# Patient Record
Sex: Male | Born: 1975 | Race: White | Hispanic: No | Marital: Married | Smoking: Never smoker
Health system: Southern US, Community
[De-identification: ages and names within clinical notes are randomized; demographics above are authoritative.]

## PROBLEM LIST (undated history)

## (undated) DIAGNOSIS — G4730 Sleep apnea, unspecified: Secondary | ICD-10-CM

## (undated) DIAGNOSIS — I10 Essential (primary) hypertension: Secondary | ICD-10-CM

## (undated) DIAGNOSIS — R011 Cardiac murmur, unspecified: Secondary | ICD-10-CM

## (undated) DIAGNOSIS — E7800 Pure hypercholesterolemia, unspecified: Secondary | ICD-10-CM

## (undated) DIAGNOSIS — R519 Headache, unspecified: Secondary | ICD-10-CM

## (undated) DIAGNOSIS — J302 Other seasonal allergic rhinitis: Secondary | ICD-10-CM

## (undated) DIAGNOSIS — R51 Headache: Secondary | ICD-10-CM

## (undated) HISTORY — DX: Pure hypercholesterolemia, unspecified: E78.00

## (undated) HISTORY — DX: Headache, unspecified: R51.9

## (undated) HISTORY — PX: VASECTOMY: SHX75

## (undated) HISTORY — DX: Headache: R51

## (undated) HISTORY — PX: OTHER SURGICAL HISTORY: SHX169

## (undated) HISTORY — DX: Essential (primary) hypertension: I10

## (undated) HISTORY — DX: Cardiac murmur, unspecified: R01.1

## (undated) HISTORY — DX: Sleep apnea, unspecified: G47.30

## (undated) HISTORY — DX: Other seasonal allergic rhinitis: J30.2

---

## 2015-11-18 ENCOUNTER — Encounter: Payer: Self-pay | Admitting: Neurology

## 2015-11-18 ENCOUNTER — Ambulatory Visit (INDEPENDENT_AMBULATORY_CARE_PROVIDER_SITE_OTHER): Payer: BLUE CROSS/BLUE SHIELD | Admitting: Neurology

## 2015-11-18 ENCOUNTER — Ambulatory Visit: Payer: Self-pay | Admitting: Neurology

## 2015-11-18 VITALS — BP 158/88 | HR 88 | Resp 20 | Ht 71.0 in | Wt 265.0 lb

## 2015-11-18 DIAGNOSIS — G4733 Obstructive sleep apnea (adult) (pediatric): Secondary | ICD-10-CM

## 2015-11-18 DIAGNOSIS — Z9989 Dependence on other enabling machines and devices: Secondary | ICD-10-CM

## 2015-11-18 DIAGNOSIS — D497 Neoplasm of unspecified behavior of endocrine glands and other parts of nervous system: Secondary | ICD-10-CM | POA: Diagnosis not present

## 2015-11-18 DIAGNOSIS — G43101 Migraine with aura, not intractable, with status migrainosus: Secondary | ICD-10-CM | POA: Diagnosis not present

## 2015-11-18 DIAGNOSIS — M2619 Other specified anomalies of jaw-cranial base relationship: Secondary | ICD-10-CM | POA: Diagnosis not present

## 2015-11-18 MED ORDER — TOPIRAMATE 25 MG PO TABS: ORAL_TABLET | ORAL | Status: AC

## 2015-11-18 NOTE — Progress Notes (Signed)
S[facility]EP MED[facility]INE [facility]IC   Provider:  Larey Seat, M D  Referring Provider: Pomposini, Cherly Anderson, MD Primary Care Physician:  No primary care provider on file.  Chief Complaint  Patient presents with  . New Patient (Initial Visit)    6 headaches in the last 11 days, pt has been tracking headaches, rm 11  . headaches    HPI:  Joe Thompson is a 40 y.o. male, seen here as a referral from Dr. Harmon Thompson of [place], [place]. , for a headache evaluation,  And the origin of his headache seems to be in the temple area bilaterally. He describes by a hand motion that the headaches originated at these trigger zones then radiates over the temporal parietal area towards the back of the head. The headaches develop during the course of the day. He has not been woken out of sleep by these headaches and only rare times woken with a headache. There is some photophobia described sensitivity to light. And just yesterday his headaches were accompanied by nausea. By the headaches can arise after lunch or during the afternoon they may last as short as 30 minutes given that he has Access Excedrin Migraine medication or they may last several hours if remaining untreated. He would estimate to have had 20 headaches in summer/ Fall. Began tracking them over the last 3 month increasing in frequency. He has always had headaches with similar quality and intensity , but less frequently. Yesterday , he felt he needed to hide from light and sound, and bacame sick. These intense headaches occur, about 3-4 times a month, the less intense 3-4 times a week.    He has been gainfully employed in the same position but over the last year he has been exposed to more stress, as his company went to acquire a new [employer].  Joe Thompson has not noted any associated sinus congestion, bronchitis, febrile illness, diarrhea only nausea.  He has never been treated with preventive medications for migraines.  We discussed propranolol, depakote  and topiramate and settled on topamax. topiramate has the added benefit of assisting and weight loss. Joe Thompson also is a CPAP user and was diagnosed with obstructive sleep apnea about 9 months ago. The implementation of CPAP therapy did not affect headache frequency or character of the headaches. He had a diagnostic AHi of 100/hr. And has full facial hair. Now 2 per hour. Using a nasal pillow. Sleep habits are as follows: most nights sleeps 6-7 hours on CPAP.    Medical history and family history: Brother has migraines, sister used to have them. Son has headaches , coing home from school with vision and nausea. 38 years old.   Social history: married, full time employed, one son. Non smoker, caffeine user, 5-7  drinks a day, coffee, iced tea, soda.   Review of Systems: Out of a complete 14 system review, the patient complains of only the following symptoms, and all other reviewed systems are negative.  HA   Social History   Social History  . Marital Status: Married    Spouse Name: N/A  . Number of Children: N/A  . Years of Education: N/A   Occupational History  .      works from [employer]   Social History Main Topics  . Smoking status: Never Smoker   . Smokeless tobacco: Not on file  . Alcohol Use: No  . Drug Use: No  . Sexual Activity: Not on file   Other Topics Concern  .  Not on file   Social History Narrative   Drinks 6-8 cups of caffeine daily.    Family History  Problem Relation Age of Onset  . Sleep apnea Father   . Hypertension Father     Past Medical History  Diagnosis Date  . Hypertension   . High cholesterol   . Seasonal allergies     allergy shots  . Headache   . Sleep apnea   . Heart murmur     Past Surgical History  Procedure Laterality Date  . Hernia repair    . Vasectomy      Current Outpatient Prescriptions  Medication Sig Dispense Refill  . atorvastatin (LIPITOR) 10 MG tablet Take 10 mg by mouth daily.    . ergocalciferol (VITAMIN D2) 50000  units capsule Take 50,000 Units by mouth once a week.    . telmisartan-hydrochlorothiazide (MICARDIS HCT) 40-12.5 MG tablet Take 1 tablet by mouth daily.    . vitamin A 8000 UNIT capsule Take 8,000 Units by mouth daily.    . vitamin C (ASCORBIC ACID) 500 MG tablet Take 500 mg by mouth daily.     No current facility-administered medications for this visit.    Allergies as of 11/18/2015  . (Not on File)    Vitals: BP 158/88 mmHg  Pulse 88  Resp 20  Ht 5\' 11"  (1.803 m)  Wt 265 lb (120.203 kg)  BMI 36.98 kg/m2 Last Weight:  Wt Readings from Last 1 Encounters:  11/18/15 265 lb (120.203 kg)   [id number] mass index is 36.98 kg/(m^2).     Last Height:   Ht Readings from Last 1 Encounters:  11/18/15 5\' 11"  (1.803 m)    Physical exam:  General: The patient is awake, alert and appears not in acute distress. The patient is well groomed. Head: Normocephalic, atraumatic. Neck is supple. Mallampati 4,  neck circumference:19.  Retrognathia is seen.  Cardiovascular:  Regular rate and rhythm , without  murmurs or carotid bruit, and without distended neck veins. Respiratory: Lungs are clear to auscultation. Skin:  Without evidence of edema, or rash Trunk: BMI is 37. The patient's posture is erect    Neurologic exam : The patient is awake and alert, oriented to place and time.   Memory subjective  described as intact.  Attention span & concentration ability appears normal.  Speech is fluent,  without dysarthria, dysphonia or aphasia.  Mood and affect are appropriate.  Cranial nerves: Pupils are equal and briskly reactive to light. Funduscopic exam without evidence of pallor or edema.  Extraocular movements  in vertical and horizontal planes intact and without nystagmus. Visual fields by finger perimetry are intact. Hearing to finger rub intact.   Facial sensation intact to fine touch.  Facial motor strength is symmetric and tongue and uvula move midline. Shoulder shrug was symmetrical.    Motor exam:  Normal tone, muscle bulk and symmetric strength in all extremities.  Sensory:  Fine touch, pinprick and vibration were tested in all extremities. Proprioception tested in the upper extremities was normal.  Coordination: Rapid alternating movements in the fingers/hands was normal. Finger-to-nose maneuver  normal without evidence of ataxia, dysmetria or tremor.  Gait and station: Patient walks without assistive device and is able unassisted to climb up to the exam table. Strength within normal limits.  Stance is stable and normal .Tandem gait is unfragmented. Turns with 3 Steps. Romberg testing is negative.  Deep tendon reflexes: in the  upper and lower extremities are attenuated , but symmetric .  Babinski maneuver response is  downgoing.  The patient was advised of the nature of the diagnosed sleep disorder , the treatment options and risks for general a health and wellness arising from not treating the condition.  I spent more than 40 minutes of face to face time with the patient. Greater than 50% of time was spent in counseling and coordination of care. We have discussed the diagnosis and differential and I answered the patient's questions.     Assessment:  After physical and neurologic examination, review of laboratory studies,  Personal review of imaging studies, reports of other /same  Imaging studies,  Results of polysomnography/ neurophysiology testing and pre-existing records as far as provided in visit., my assessment is   1) Migraine headaches, increasing with stress. Not improved by sleep.  2) The patient has migraines- needs to reduce caffeine and start topiramate 25 mg at night. After 8 days start to take either 25 bid, or 50 at night.  Keep headache diary.   3) OSA treated by Dr. Felton Clinton, [place].  4) obesity- higher risk factor for vascular headaches, he has HTN, too. On HCTZ.  Plan:  Treatment plan and additional workup :  Prescribed topiramate, if not  tolerated will use Propranolol.   Asencion Partridge Lue Sykora MD  11/18/2015   CC: Cherly Thompson Pomposini, Md No address on file

## 2015-11-18 NOTE — Patient Instructions (Signed)

## 2015-11-24 ENCOUNTER — Telehealth: Payer: Self-pay | Admitting: Neurology

## 2015-11-24 NOTE — Telephone Encounter (Signed)
Spoke with pt. He is concerned about the side effects of topamax and therefore has not started taking the topamax. He wanted to inform Dr. Brett Fairy that he is going to try and reduce his caffeine intake to hopefully reduce his headaches before starting the topamax. He just wanted Dr. Brett Fairy to be aware of this and if she has any contraindications to him doing this, to let him know.  I advised pt that I would inform Dr. Brett Fairy and if she has any further feedback for the pt, that I would call him back. Pt verbalized understanding.

## 2015-11-24 NOTE — Telephone Encounter (Signed)
Pt called said he concerned about side effects from topiramate (TOPAMAX) 25 MG tablet . He said Dr Brett Fairy had mentioned some life style changes he could make that would help to decrease HA's. He is inquiring if he could try life style changes (dietary) and wait on trying the medication.

## 2015-11-24 NOTE — Telephone Encounter (Signed)
Called to discuss. Line was busy.

## 2015-12-09 ENCOUNTER — Ambulatory Visit
Admission: RE | Admit: 2015-12-09 | Discharge: 2015-12-09 | Disposition: A | Discharge status: Home / Self Care | Payer: BLUE CROSS/BLUE SHIELD | Source: Ambulatory Visit | Attending: Neurology | Admitting: Neurology

## 2015-12-09 ENCOUNTER — Telehealth: Payer: Self-pay | Admitting: Neurology

## 2015-12-09 ENCOUNTER — Ambulatory Visit (INDEPENDENT_AMBULATORY_CARE_PROVIDER_SITE_OTHER): Payer: BLUE CROSS/BLUE SHIELD

## 2015-12-09 DIAGNOSIS — G43101 Migraine with aura, not intractable, with status migrainosus: Secondary | ICD-10-CM | POA: Diagnosis not present

## 2015-12-09 DIAGNOSIS — M2619 Other specified anomalies of jaw-cranial base relationship: Secondary | ICD-10-CM

## 2015-12-09 DIAGNOSIS — G4733 Obstructive sleep apnea (adult) (pediatric): Secondary | ICD-10-CM

## 2015-12-09 DIAGNOSIS — D497 Neoplasm of unspecified behavior of endocrine glands and other parts of nervous system: Secondary | ICD-10-CM

## 2015-12-09 DIAGNOSIS — Z9989 Dependence on other enabling machines and devices: Secondary | ICD-10-CM

## 2015-12-09 NOTE — Telephone Encounter (Signed)
Spoke to Dr. Brett Fairy regarding this order. She asked me to place an order for XR orbits for pt. Order placed.

## 2015-12-09 NOTE — Telephone Encounter (Signed)
Patient needs to have a xray of his orbits due to him possibly having metal in his eyes. Patient is scheduled for today @ 1:15PM for his MRI. Patient will need to go to [facility] to have his xray of orbits today before his MRI appointment. Patient is aware of this. Please place in the xray order. Thanks!

## 2015-12-10 MED ORDER — GADOPENTETATE DIMEGLUMINE 469.01 MG/ML IV SOLN: 20.0000 mL | Freq: Once | INTRAVENOUS | Status: AC | PRN

## 2015-12-14 ENCOUNTER — Ambulatory Visit: Payer: Self-pay | Admitting: Neurology

## 2015-12-16 ENCOUNTER — Telehealth: Payer: Self-pay | Admitting: Neurology

## 2015-12-16 NOTE — Telephone Encounter (Signed)
Patient called to request MRI results, please call [phone].

## 2015-12-16 NOTE — Telephone Encounter (Signed)
Spoke to patient and he is aware of results.

## 2015-12-16 NOTE — Telephone Encounter (Signed)
-----   Message from Larey Seat, MD sent at 12/16/2015  4:59 PM EST ----- Normal MRI

## 2016-01-18 ENCOUNTER — Ambulatory Visit: Payer: BLUE CROSS/BLUE SHIELD | Admitting: Neurology

## 2016-02-04 ENCOUNTER — Ambulatory Visit (INDEPENDENT_AMBULATORY_CARE_PROVIDER_SITE_OTHER): Payer: BLUE CROSS/BLUE SHIELD | Admitting: Neurology

## 2016-02-04 ENCOUNTER — Encounter: Payer: Self-pay | Admitting: Neurology

## 2016-02-04 VITALS — BP 120/88 | HR 78 | Resp 20 | Ht 70.0 in | Wt 261.0 lb

## 2016-02-04 DIAGNOSIS — J322 Chronic ethmoidal sinusitis: Secondary | ICD-10-CM | POA: Insufficient documentation

## 2016-02-04 DIAGNOSIS — J0121 Acute recurrent ethmoidal sinusitis: Secondary | ICD-10-CM | POA: Diagnosis not present

## 2016-02-04 DIAGNOSIS — B028 Zoster with other complications: Secondary | ICD-10-CM

## 2016-02-04 DIAGNOSIS — G44039 Episodic paroxysmal hemicrania, not intractable: Secondary | ICD-10-CM

## 2016-02-04 DIAGNOSIS — B029 Zoster without complications: Secondary | ICD-10-CM | POA: Insufficient documentation

## 2016-02-04 NOTE — Progress Notes (Signed)
[facility]EP [facility]   Provider:  Larey Seat, M D  Referring Provider: Clinton Quant, MD Primary Care Physician:  Clinton Quant, MD  Chief Complaint  Patient presents with  . Follow-up    headaches are better, still very tired, has cut caffeine out of his diet, stopped taking topamax, rm 11, alone    HPI:  Joe Thompson is a 40 y.o. male,  Was seen here as a referral from Dr. Harmon Pier of [place], [place]. , for a headache evaluation,  And the origin of his headache seems to be in the temple area bilaterally. He describes by a hand motion that the headaches originated at these trigger zones then radiates over the temporal parietal area towards the back of the head. The headaches develop during the course of the day. He has not been woken out of sleep by these headaches and only rare times woken with a headache. There is some photophobia described sensitivity to light. And just yesterday his headaches were accompanied by nausea. By the headaches can arise after lunch or during the afternoon they may last as short as 30 minutes given that he has Access Excedrin Migraine medication or they may last several hours if remaining untreated. He would estimate to have had 20 headaches in summer/ Fall. Began tracking them over the last 3 month increasing in frequency. He has always had headaches with similar quality and intensity , but less frequently. Yesterday , he felt he needed to hide from light and sound, and bacame sick. These intense headaches occur, about 3-4 times a month, the less intense 3-4 times a week.   He has been gainfully employed in the same position but over the last year he has been exposed to more stress, as his company went to acquire a new [place].  Joe Thompson has not noted any associated sinus congestion, bronchitis, febrile illness, diarrhea only nausea.  He has never been treated with preventive medications for migraines.  We discussed propranolol, depakote  and topiramate and settled on topamax. topiramate has the added benefit of assisting and weight loss. Joe Thompson also is a CPAP user and was diagnosed with obstructive sleep apnea about 9 months ago. The implementation of CPAP therapy did not affect headache frequency or character of the headaches. He had a diagnostic AHi of 100/hr. And has full facial hair. Now 2 per hour. Using a nasal pillow. Sleep habits are as follows: most nights sleeps 6-7 hours on CPAP.    Medical history and family history: Brother has migraines, sister used to have them. Son has headaches , coing home from school with vision and nausea. 38 years old. Social history: married, full time employed, one son. Non smoker, caffeine user, 5-7  drinks a day, coffee, iced tea, soda.    Interval history from 02/04/2016. Joe Thompson reports that his headaches have significantly improved since he eliminated or reduced caffeine significantly from his daily intake. He also could discontinue topiramate and still does not have the same headache frequency and intensity as before. An MRI of the brain have been obtained and interpreted by my colleague Dr. Bonnita Levan on 12-09-15 and revealed no abnormal structures. The patient reports that he is sleepier, may be because he is has reduced his caffeine intake but his most significant symptom is fatigue. He endorsed today the Epworth score at 6 and fatigue severity score at 46 points. We also here to review the compliance data from his recent sleep study ( AHI 100!) .  I had mentioned in this last visit that the implementation of CPAP therapy did not affect his headache overall. He has frequent sinus congestion.  He remains a very compliant user of CPAP was 97% compliance for days, 93% compliance for over 4 hours and at a CPAP pressure of 10 cm water pressure. There is a setting for 2 cm water expiratory pressure relief. Average user time is 6 hours and 9 minutes and residual AHI is 1.5- this is an excellent  resolution of apnea. He does have occasional air leaks and they increase at the time his interface needs to be replaced. The patient does have full facial hair and retrognathia  Review of Systems: Out of a complete 14 system review, the patient complains of only the following symptoms, and all other reviewed systems are negative.  HA improved, fatigue worsened, change in caffeine intake. No longer on TPM.   Social History   Social History  . Marital Status: Married    Spouse Name: N/A  . Number of Children: N/A  . Years of Education: N/A   Occupational History  .      works from [employer]   Social History Main Topics  . Smoking status: Never Smoker   . Smokeless tobacco: Not on file  . Alcohol Use: No  . Drug Use: No  . Sexual Activity: Not on file   Other Topics Concern  . Not on file   Social History Narrative   Drinks 6-8 cups of caffeine daily.    Family History  Problem Relation Age of Onset  . Sleep apnea Father   . Hypertension Father     Past Medical History  Diagnosis Date  . Hypertension   . High cholesterol   . Seasonal allergies     allergy shots  . Headache   . Sleep apnea   . Heart murmur     Past Surgical History  Procedure Laterality Date  . Hernia repair    . Vasectomy      Current Outpatient Prescriptions  Medication Sig Dispense Refill  . atorvastatin (LIPITOR) 10 MG tablet Take 10 mg by mouth daily.    . ergocalciferol (VITAMIN D2) 50000 units capsule Take 50,000 Units by mouth once a week.    . Multiple Vitamins-Minerals (CENTRUM SILVER PO) Take by mouth.    . telmisartan-hydrochlorothiazide (MICARDIS HCT) 40-12.5 MG tablet Take 1 tablet by mouth daily.    . vitamin A 8000 UNIT capsule Take 8,000 Units by mouth daily.    . vitamin C (ASCORBIC ACID) 500 MG tablet Take 500 mg by mouth daily.    Marland Kitchen topiramate (TOPAMAX) 25 MG tablet Take 25 mg at night for 1 week, then increase to2 tablets per day for 1 week. If you do not encounter any side  effects but still have frequent headaches you may increase to 75 and finally even 100 mg nightly. (Patient not taking: Reported on 02/04/2016) 90 tablet 3   No current facility-administered medications for this visit.   Facility-Administered Medications Ordered in Other Visits  Medication Dose Route Frequency Provider Last Rate Last Dose  . gadopentetate dimeglumine (MAGNEVIST) injection 20 mL  20 mL Intravenous Once PRN Larey Seat, MD        Allergies as of 02/04/2016 - Review Complete 02/04/2016  Allergen Reaction Noted  . Minocin [minocycline hcl]  02/04/2016    Vitals: BP 120/88 mmHg  Pulse 78  Resp 20  Ht 5\' 10"  (1.778 m)  Wt 261 lb (118.389 kg)  BMI 37.45 kg/m2 Last Weight:  Wt Readings from Last 1 Encounters:  02/04/16 261 lb (118.389 kg)   [id number] mass index is 37.45 kg/(m^2).     Last Height:   Ht Readings from Last 1 Encounters:  02/04/16 5\' 10"  (1.778 m)    Physical exam:  General: The patient is awake, alert and appears not in acute distress. The patient is well groomed. Head: Normocephalic, atraumatic. Neck is supple. Mallampati 4,  neck circumference:19.  Retrognathia is seen.  Cardiovascular:  Regular rate and rhythm , without  murmurs or carotid bruit, and without distended neck veins. Respiratory: Lungs are clear to auscultation. Skin:  Without evidence of edema, or rash Trunk: BMI is 37. The patient's posture is erect    Neurologic exam : The patient is awake and alert, oriented to place and time.   Memory subjective  described as intact.  Attention span & concentration ability appears normal.  Speech is fluent,  without dysarthria, dysphonia or aphasia.  Mood and affect are appropriate.  Cranial nerves: Pupils are equal and briskly reactive to light. Funduscopic exam without evidence of pallor or edema.  Extraocular movements  in vertical and horizontal planes intact and without nystagmus. Visual fields by finger perimetry are intact. Hearing  to finger rub intact.   Facial sensation intact to fine touch.  Facial motor strength is symmetric and tongue and uvula move midline. Shoulder shrug was symmetrical.   Deep tendon reflexes: in the  upper and lower extremities are attenuated , but symmetric . Babinski maneuver response is  downgoing.  The patient was advised of the nature of the diagnosed sleep disorder , the treatment options and risks for general a health and wellness arising from not treating the condition.  I spent more than 20 minutes of face to face time with the patient. Greater than 50% of time was spent in counseling and coordination of care. We have discussed the diagnosis and differential and I answered the patient's questions.     Assessment:  After physical and neurologic examination, review of laboratory studies,  Personal review of imaging studies, reports of other /same  Imaging studies,  Results of polysomnography/ neurophysiology testing and pre-existing records as far as provided in visit., my assessment is   1) Migraine headaches, improved by caffeine reduction   2) OSA  Was treated by Dr. Felton Clinton, [place]. High compliance 3) obesity- higher risk factor for vascular headaches, he has HTN, too. On HCTZ. 4) allergic sinusitis.   Plan:  Treatment plan and additional workup :  Allegra every day and nasal spray prn.  Low carb diet to reduce BMI May need to return to his allergy specialist. His brain MRI did not show sinus retention.    Joe Partridge Millissa Deese MD  02/04/2016   CC: Cherly Anderson Pomposini, Md No address on file

## 2017-06-26 IMAGING — CR DG ORBITS FOR FOREIGN BODY
2 series · 2 of 2 positions shown · non-contrast
Comparison: None.

CLINICAL DATA: Metal working/exposure; clearance prior to MRI

EXAM:
ORBITS FOR FOREIGN BODY - 2 VIEW

[w orbit pa (1 of 2)]
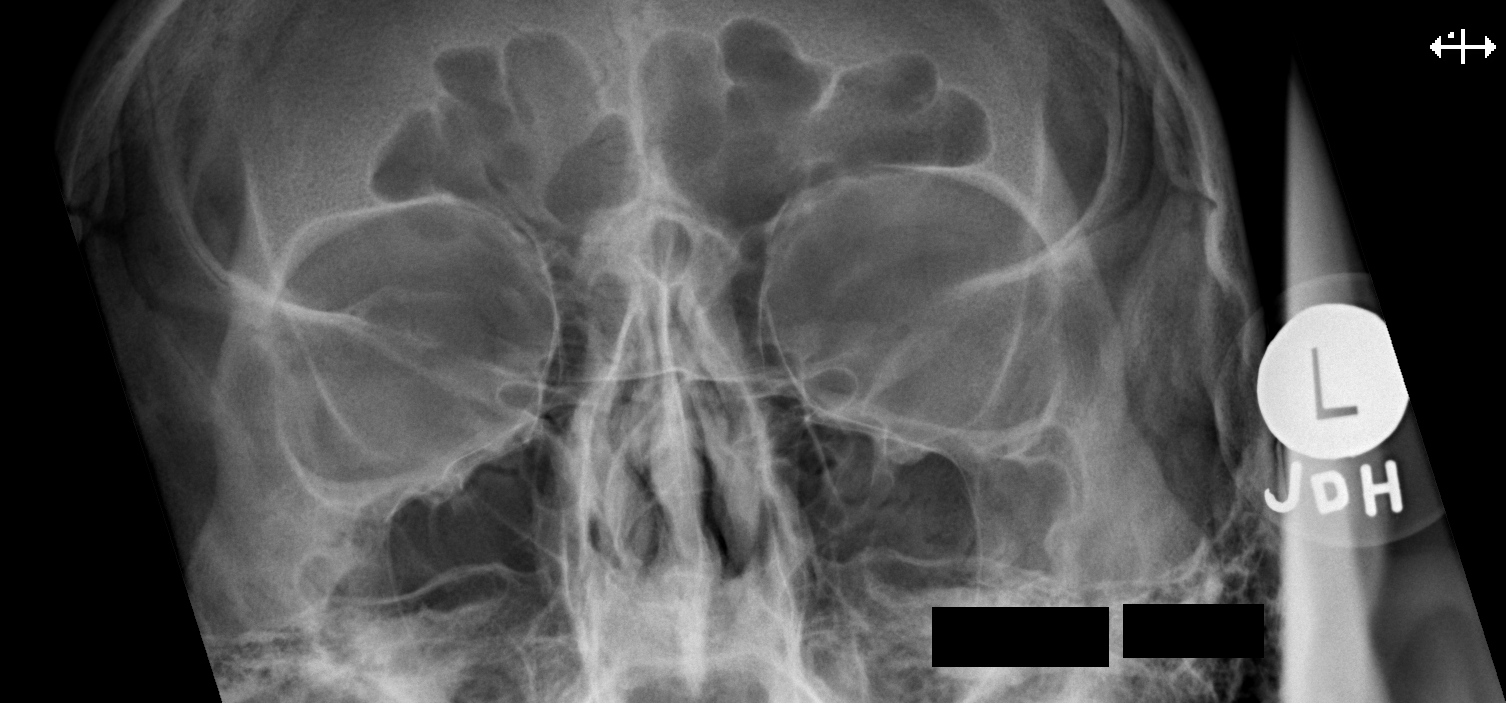

[w orbit pa (2 of 2)]
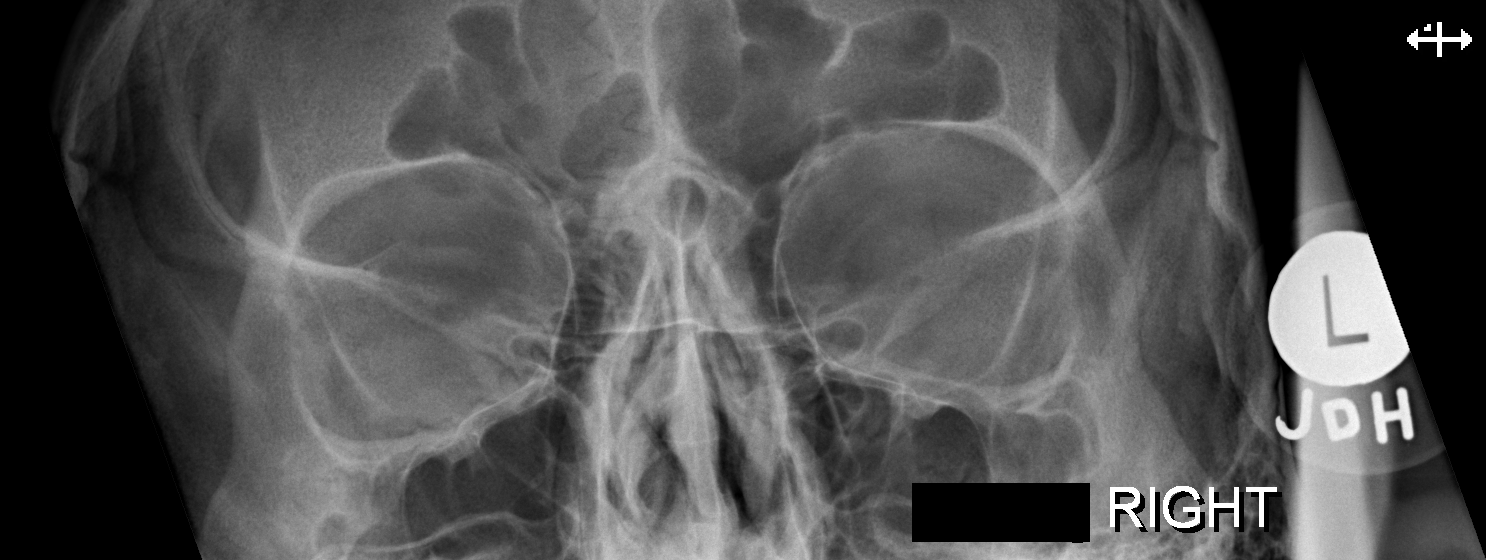

[2 of 2 positions shown; findings below may reference images not displayed]

FINDINGS: There is no evidence of metallic foreign body within the orbits. No
significant bone abnormality identified.
IMPRESSION: No evidence of metallic foreign body within the orbits.
